# Patient Record
Sex: Female | Born: 1960 | Race: White | Hispanic: No | Marital: Single
Health system: Southern US, Community
[De-identification: ages and names within clinical notes are randomized; demographics above are authoritative.]

## PROBLEM LIST (undated history)

## (undated) DIAGNOSIS — E7800 Pure hypercholesterolemia, unspecified: Secondary | ICD-10-CM

---

## 2020-08-04 ENCOUNTER — Emergency Department (HOSPITAL_COMMUNITY): Payer: Self-pay

## 2020-08-04 ENCOUNTER — Emergency Department (HOSPITAL_COMMUNITY)
Admission: EM | Admit: 2020-08-04 | Discharge: 2020-08-04 | Disposition: A | Discharge status: Home / Self Care | Payer: Self-pay | Attending: Emergency Medicine | Admitting: Emergency Medicine

## 2020-08-04 ENCOUNTER — Encounter (HOSPITAL_COMMUNITY): Payer: Self-pay | Admitting: Pharmacy Technician

## 2020-08-04 DIAGNOSIS — S82851A Displaced trimalleolar fracture of right lower leg, initial encounter for closed fracture: Secondary | ICD-10-CM | POA: Insufficient documentation

## 2020-08-04 DIAGNOSIS — Y9301 Activity, walking, marching and hiking: Secondary | ICD-10-CM | POA: Insufficient documentation

## 2020-08-04 DIAGNOSIS — Y998 Other external cause status: Secondary | ICD-10-CM | POA: Insufficient documentation

## 2020-08-04 DIAGNOSIS — W010XXA Fall on same level from slipping, tripping and stumbling without subsequent striking against object, initial encounter: Secondary | ICD-10-CM | POA: Insufficient documentation

## 2020-08-04 DIAGNOSIS — Y92511 Restaurant or cafe as the place of occurrence of the external cause: Secondary | ICD-10-CM | POA: Insufficient documentation

## 2020-08-04 DIAGNOSIS — S82852A Displaced trimalleolar fracture of left lower leg, initial encounter for closed fracture: Secondary | ICD-10-CM

## 2020-08-04 HISTORY — DX: Pure hypercholesterolemia, unspecified: E78.00

## 2020-08-04 MED ORDER — OXYCODONE HCL 5 MG PO TABS: 5.0000 mg | ORAL_TABLET | Freq: Four times a day (QID) | ORAL | 0 refills | Status: AC | PRN

## 2020-08-04 MED ORDER — PROPOFOL 10 MG/ML IV BOLUS
INTRAVENOUS | Status: AC | PRN
  Administered 2020-08-04 (×3): 20 mg via INTRAVENOUS
  Administered 2020-08-04: 35 mg via INTRAVENOUS
  Administered 2020-08-04: 10 mg via INTRAVENOUS

## 2020-08-04 MED ORDER — FENTANYL CITRATE (PF) 100 MCG/2ML IJ SOLN: 100.0000 ug | Freq: Once | INTRAMUSCULAR | Status: AC

## 2020-08-04 MED ORDER — IBUPROFEN 600 MG PO TABS: 600.0000 mg | ORAL_TABLET | Freq: Four times a day (QID) | ORAL | 0 refills | Status: AC | PRN

## 2020-08-04 MED ORDER — FENTANYL CITRATE (PF) 100 MCG/2ML IJ SOLN
INTRAMUSCULAR | Status: AC
  Administered 2020-08-04: 100 ug via INTRAVENOUS
  Filled 2020-08-04: qty 2

## 2020-08-04 MED ORDER — SODIUM CHLORIDE 0.9 % IV BOLUS
500.0000 mL | Freq: Once | INTRAVENOUS | Status: AC
  Administered 2020-08-04: 500 mL via INTRAVENOUS

## 2020-08-04 MED ORDER — OXYCODONE-ACETAMINOPHEN 5-325 MG PO TABS
1.0000 | ORAL_TABLET | Freq: Once | ORAL | Status: AC
  Administered 2020-08-04: 1 via ORAL
  Filled 2020-08-04: qty 1

## 2020-08-04 MED ORDER — ASPIRIN 81 MG PO CHEW: 81.0000 mg | CHEWABLE_TABLET | Freq: Every day | ORAL | 0 refills | Status: AC

## 2020-08-04 MED ORDER — PROPOFOL 10 MG/ML IV BOLUS: 0.5000 mg/kg | Freq: Once | INTRAVENOUS | Status: DC

## 2020-08-04 MED ORDER — PROPOFOL 10 MG/ML IV BOLUS
INTRAVENOUS | Status: AC
  Filled 2020-08-04: qty 20

## 2020-08-04 MED ORDER — MORPHINE SULFATE (PF) 4 MG/ML IV SOLN
4.0000 mg | Freq: Once | INTRAVENOUS | Status: DC
  Filled 2020-08-04: qty 1

## 2020-08-04 MED ORDER — ACETAMINOPHEN 325 MG PO TABS: 650.0000 mg | ORAL_TABLET | Freq: Four times a day (QID) | ORAL | 0 refills | Status: AC | PRN

## 2020-08-04 NOTE — ED Notes (Signed)
EDP advised RN that patient can be discharged.

## 2020-08-04 NOTE — ED Notes (Signed)
When pt went to the restroom had syncopal episode. BP dropped to 60/36. Pt was moved from chair in restroom to stretcher. Graham crackers & water given. Monitoring until patient feels well

## 2020-08-04 NOTE — ED Provider Notes (Signed)
[facility] EMERGENCY DEPARTMENT Provider Note   CSN: [id number] Arrival date & time: 08/04/20  1242     History Chief Complaint  Patient presents with  . Ankle Injury    Susan Benitez is a 59 y.o. female.  The history is provided by the patient.  Ankle Injury This is a new problem. The current episode started less than 1 hour ago (pt was in a restaraunt and was walking and slipped on water and fell and her foot twisted under her.  unable to bear weight since). The problem occurs constantly. The problem has not changed since onset.Associated symptoms comments: Severe left ankle pain. Exacerbated by: any movement. Nothing relieves the symptoms. Treatments tried: narcotics. The treatment provided no relief.       Past Medical History:  Diagnosis Date  . High cholesterol     There are no problems to display for this patient.      OB History   No obstetric history on file.     No family history on file.  Social History   Tobacco Use  . Smoking status: Not on file  Substance Use Topics  . Alcohol use: Not on file  . Drug use: Not on file    Home Medications Prior to Admission medications   Not on File    Allergies    Bee venom  Review of Systems   Review of Systems  All other systems reviewed and are negative.   Physical Exam Updated Vital Signs BP (!) 201/85   Pulse 71   Temp 97.8 F (36.6 C) (Oral)   Resp 19   Wt 68.9 kg   SpO2 100%   Physical Exam Vitals and nursing note reviewed.  Constitutional:      General: She is not in acute distress.    Appearance: Normal appearance. She is well-developed and normal weight.     Comments: Appears uncomfortable  HENT:     Head: Normocephalic and atraumatic.  Eyes:     Pupils: Pupils are equal, round, and reactive to light.  Cardiovascular:     Rate and Rhythm: Normal rate and regular rhythm.     Heart sounds: Normal heart sounds. No murmur heard.  No friction rub.  Pulmonary:       Effort: Pulmonary effort is normal.     Breath sounds: Normal breath sounds. No wheezing or rales.  Abdominal:     General: Bowel sounds are normal. There is no distension.     Palpations: Abdomen is soft.     Tenderness: There is no abdominal tenderness. There is no guarding or rebound.  Musculoskeletal:        General: Tenderness present.     Left ankle: Deformity and ecchymosis present. Tenderness present. Decreased range of motion. Normal pulse.     Comments: Obvious deformity of the left lower foot around the ankle.  3sec cap refill with slight duskiness of the toes but 1+ DP pulse.  No fibular head pain or knee pain./  Skin:    General: Skin is warm and dry.     Findings: No rash.  Neurological:     General: No focal deficit present.     Mental Status: She is alert and oriented to person, place, and time. Mental status is at baseline.     Cranial Nerves: No cranial nerve deficit.  Psychiatric:        Mood and Affect: Mood normal.        Behavior: Behavior normal.  Thought Content: Thought content normal.     ED Results / Procedures / Treatments   Labs (all labs ordered are listed, but only abnormal results are displayed) Labs Reviewed - No data to display  EKG None  Radiology No results found.  Procedures .Sedation  Date/Time: 08/04/2020 3:39 PM Performed by: Gwyneth Sprout, MD Authorized by: Gwyneth Sprout, MD   Consent:    Consent obtained:  Verbal   Consent given by:  Patient   Risks discussed:  Allergic reaction, dysrhythmia, inadequate sedation, nausea, prolonged hypoxia resulting in organ damage, prolonged sedation necessitating reversal, respiratory compromise necessitating ventilatory assistance and intubation and vomiting   Alternatives discussed:  Analgesia without sedation, anxiolysis and regional anesthesia Universal protocol:    Procedure explained and questions answered to patient or proxy's satisfaction: yes     Relevant documents  present and verified: yes     Test results available and properly labeled: yes     Imaging studies available: yes     Required blood products, implants, devices, and special equipment available: yes     Site/side marked: yes     Immediately prior to procedure a time out was called: yes     Patient identity confirmation method:  Verbally with patient Indications:    Procedure performed:  Dislocation reduction   Procedure necessitating sedation performed by:  Physician performing sedation Pre-sedation assessment:    Time since last food or drink:  2   ASA classification: class 1 - normal, healthy patient     Neck mobility: normal     Mouth opening:  3 or more finger widths   Thyromental distance:  4 finger widths   Mallampati score:  I - soft palate, uvula, fauces, pillars visible   Pre-sedation assessments completed and reviewed: airway patency, cardiovascular function, hydration status, mental status, nausea/vomiting, pain level, respiratory function and temperature   Immediate pre-procedure details:    Reassessment: Patient reassessed immediately prior to procedure     Reviewed: vital signs, relevant labs/tests and NPO status     Verified: bag valve mask available, emergency equipment available, intubation equipment available, IV patency confirmed, oxygen available and suction available   Procedure details (see MAR for exact dosages):    Preoxygenation:  Nasal cannula   Sedation:  Propofol   Intended level of sedation: deep   Analgesia:  Fentanyl   Intra-procedure monitoring:  Blood pressure monitoring, cardiac monitor, continuous pulse oximetry, frequent LOC assessments, frequent vital sign checks and continuous capnometry   Intra-procedure events: none     Total Provider sedation time (minutes):  10 Post-procedure details:    Post-sedation assessment completed:  08/04/2020 2:40 PM   Attendance: Constant attendance by certified staff until patient recovered     Recovery: Patient  returned to pre-procedure baseline     Post-sedation assessments completed and reviewed: airway patency, cardiovascular function, hydration status, mental status, nausea/vomiting, pain level, respiratory function and temperature     Patient is stable for discharge or admission: yes     Patient tolerance:  Tolerated well, no immediate complications Reduction of fracture  Date/Time: 08/04/2020 3:41 PM Performed by: Gwyneth Sprout, MD Authorized by: Gwyneth Sprout, MD  Preparation: Patient was prepped and draped in the usual sterile fashion. Local anesthesia used: no  Anesthesia: Local anesthesia used: no  Sedation: Patient sedated: yes Sedation type: moderate (conscious) sedation Sedatives: propofol Analgesia: fentanyl  Patient tolerance: patient tolerated the procedure well with no immediate complications Comments: With gentle traction and flexion ankle dislocation was reduced.  catalac splint placed    (including critical care time)  Medications Ordered in ED Medications  propofol (DIPRIVAN) 10 mg/mL bolus/IV push 34.5 mg (has no administration in time range)  sodium chloride 0.9 % bolus 500 mL (has no administration in time range)  propofol (DIPRIVAN) 10 mg/mL bolus/IV push (has no administration in time range)    ED Course  I have reviewed the triage vital signs and the nursing notes.  Pertinent labs & imaging results that were available during my care of the patient were reviewed by me and considered in my medical decision making (see chart for details).    MDM Rules/Calculators/A&P                          Patient is a 59 year old female who slipped today in a restaurant and fell.  Patient has obvious fracture dislocation of the left ankle.  She is able to wiggle her feet and does have a palpable pulse but toes are cool and slightly dusky.  Will reduce the ankle placed in a splint and image.  Discussed sedation with patient and she has consented for propofol  sedation.  Final Clinical Impression(s) / ED Diagnoses Final diagnoses:  None    Rx / DC Orders ED Discharge Orders    None       Gwyneth Sprout, MD 08/04/20 1541

## 2020-08-04 NOTE — Discharge Instructions (Addendum)
You have a tri-malleolar fracture of your left ankle.  You should NOT PUT ANY WEIGHT on your left foot until you are cleared by an orthopedic surgeon.  You need to see an orthopedic surgeon in 1 week in the office for a skin check, and to discuss further steps.  Your injury may require an operation to fix.   You should start taking 81 mg aspirin daily for blood clot prevention.  You can take 600 mg motrin (ibuprofen) and 650 mg tylenol every 6 hours for pain.  You can also take oxycodone 5 mg every 6 hours for severe pain.  Keep your cast dry at home.  Elevate your leg as much as possible.  If your foot goes completely numb, or your toes turn blue or purple, you need to get to a local ER as quickly as possible.  These may be signs of new compression of your blood vessels or nerves in your feet.

## 2020-08-04 NOTE — Progress Notes (Signed)
Orthopedic Tech Progress Note Patient Details:  Susan Benitez 20-Feb-1961 [id number]  Ortho Devices Type of Ortho Device: Stirrup splint Ortho Device/Splint Location: Patient Left Leg Ortho Device/Splint Interventions: Ordered, Application   Post Interventions Patient Tolerated: Well Instructions Provided: Adjustment of device, Care of device   Zebedee Iba 08/04/2020, 3:38 PM

## 2020-08-04 NOTE — ED Triage Notes (Signed)
Pt bib ems with reports of falling with obvious deformity to L ankle. Pt states she slipped, causing her to fall. Denies LOC, no blood thinners. Initial palpated BP 180. HR 90. RR 18, 20g RAC. fentanyl given en route.

## 2020-08-04 NOTE — ED Provider Notes (Signed)
Pt signed out to me by Dr Anitra Lauth, briefly 59 year old female presenting with mechanical fall here with ankle fracture/dislocation, reduced by Dr Anitra Lauth with propofol, pending ortho consult.  Planning to return to their home in [place] on Sunday, here visiting daughter.  Clinical Course as of Aug 04 1830  Fri Aug 04, 2020  1751 I spoke to Dr Aundria Rud from orthopedics who felt the patient could be discahrged on crutches, NWB, and needs ortho f/u in 1 week.  The patient and her husband have already made an appointment with an orthopedist in [place] for Tuesday, which is well timed.  They can return home tomorrow as planned. I'll provide pain scripts, and per Dr Aundria Rud we can start her on 81 mg aspirin as DVT ppx.     [MT]    Clinical Course User Index [MT] Demarie Uhlig, Kermit Balo, MD      Terald Sleeper, MD 08/04/20 [phone]

## 2021-08-18 IMAGING — DX DG TIBIA/FIBULA 2V*L*
2 series · 2 of 2 positions shown · non-contrast
Comparison: Concurrent ankle exam.

CLINICAL DATA: Pain and deformity. Slip on wet floor at a
restaurant with left leg pain and ankle deformity.

EXAM:
LEFT TIBIA AND FIBULA - 2 VIEW

[tibia ap]
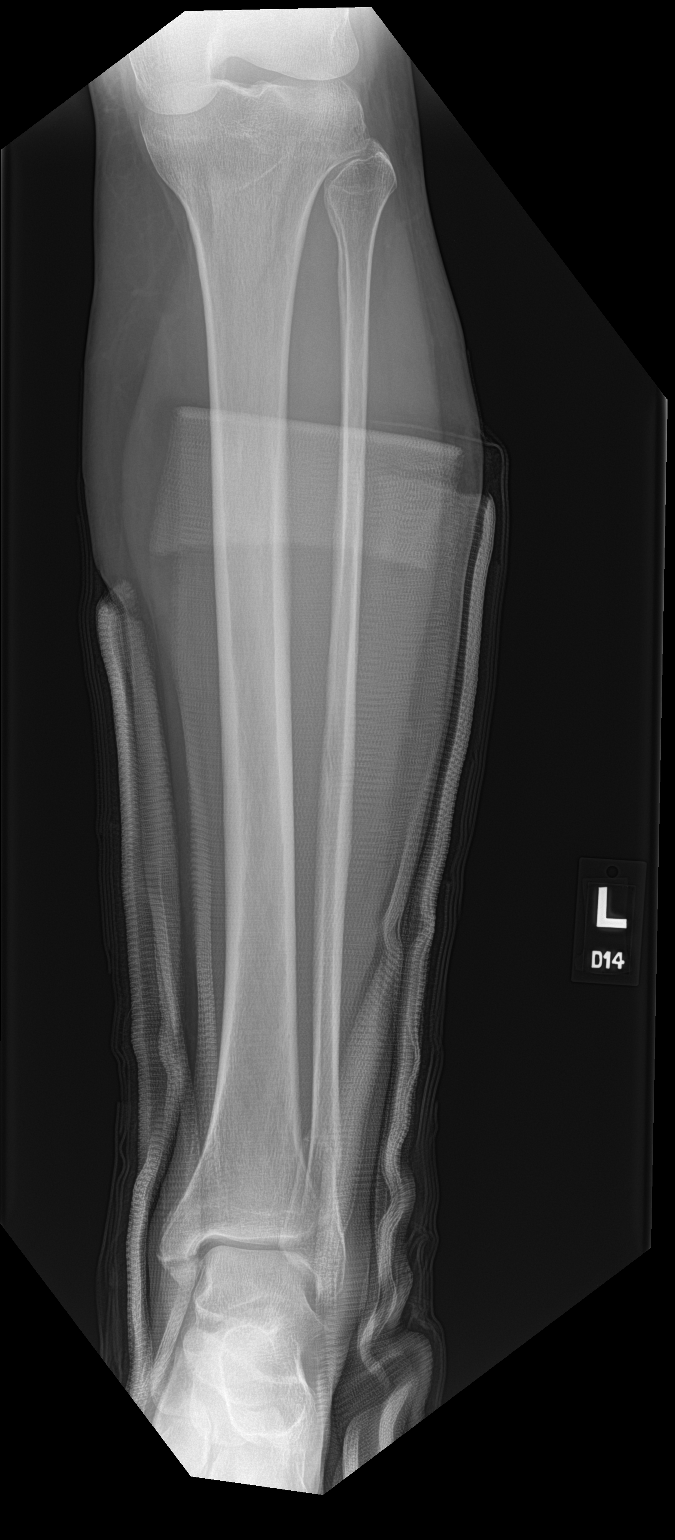

[tibia lat]
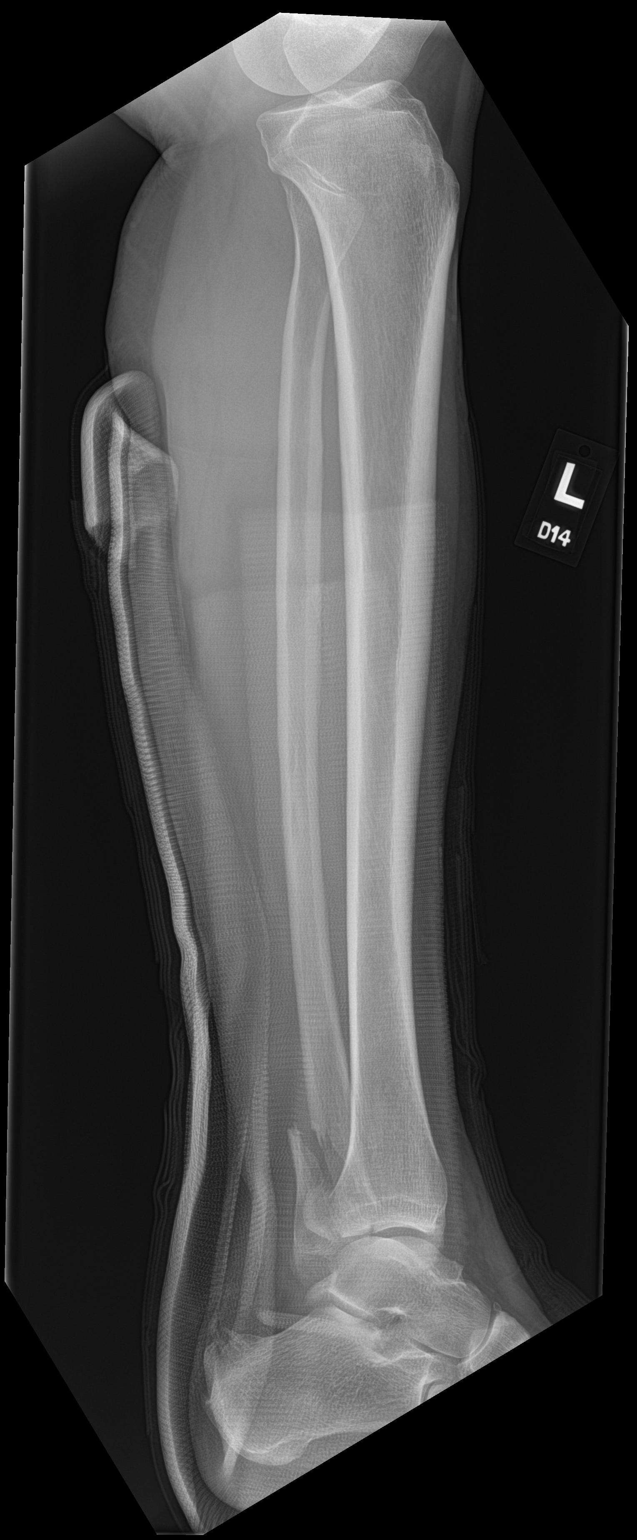

[2 of 2 positions shown; findings below may reference images not displayed]

FINDINGS: Trimalleolar ankle fracture is better assessed on concurrent ankle
exam. There is no additional fracture of the proximal lower leg.
Knee alignment is maintained. Overlying splint material in place.
IMPRESSION: Trimalleolar ankle fracture is better assessed on concurrent ankle
exam. No additional fracture of the lower leg.
